# Patient Record
Sex: Female | Born: 1985 | Race: White | Hispanic: No | Marital: Married | Smoking: Never smoker
Health system: Southern US, Community
[De-identification: ages and names within clinical notes are randomized; demographics above are authoritative.]

## PROBLEM LIST (undated history)

## (undated) ENCOUNTER — Inpatient Hospital Stay: Payer: Self-pay | Admitting: Obstetrics and Gynecology

---
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
## 2012-01-16 ENCOUNTER — Ambulatory Visit: Payer: Self-pay | Admitting: General Practice

## 2013-01-13 ENCOUNTER — Ambulatory Visit: Payer: Self-pay | Admitting: Emergency Medicine

## 2014-05-19 DIAGNOSIS — M4147 Neuromuscular scoliosis, lumbosacral region: Secondary | ICD-10-CM | POA: Insufficient documentation

## 2014-05-24 IMAGING — CR DG FOOT COMPLETE 3+V*L*
1 series · 3 of 3 positions shown · non-contrast
Comparison: none

REASON FOR EXAM: INJURY
COMMENTS:

PROCEDURE:     MDR - MDR FOOT LT COMP W/OBLQUES  - January 13, 2013  [DATE]
RESULT:     There is no evidence of fracture, dislocation, or malalignment.

[Series 1: ap · 0.17mm/px · 3 of 3 slices shown]
[im 1/3]
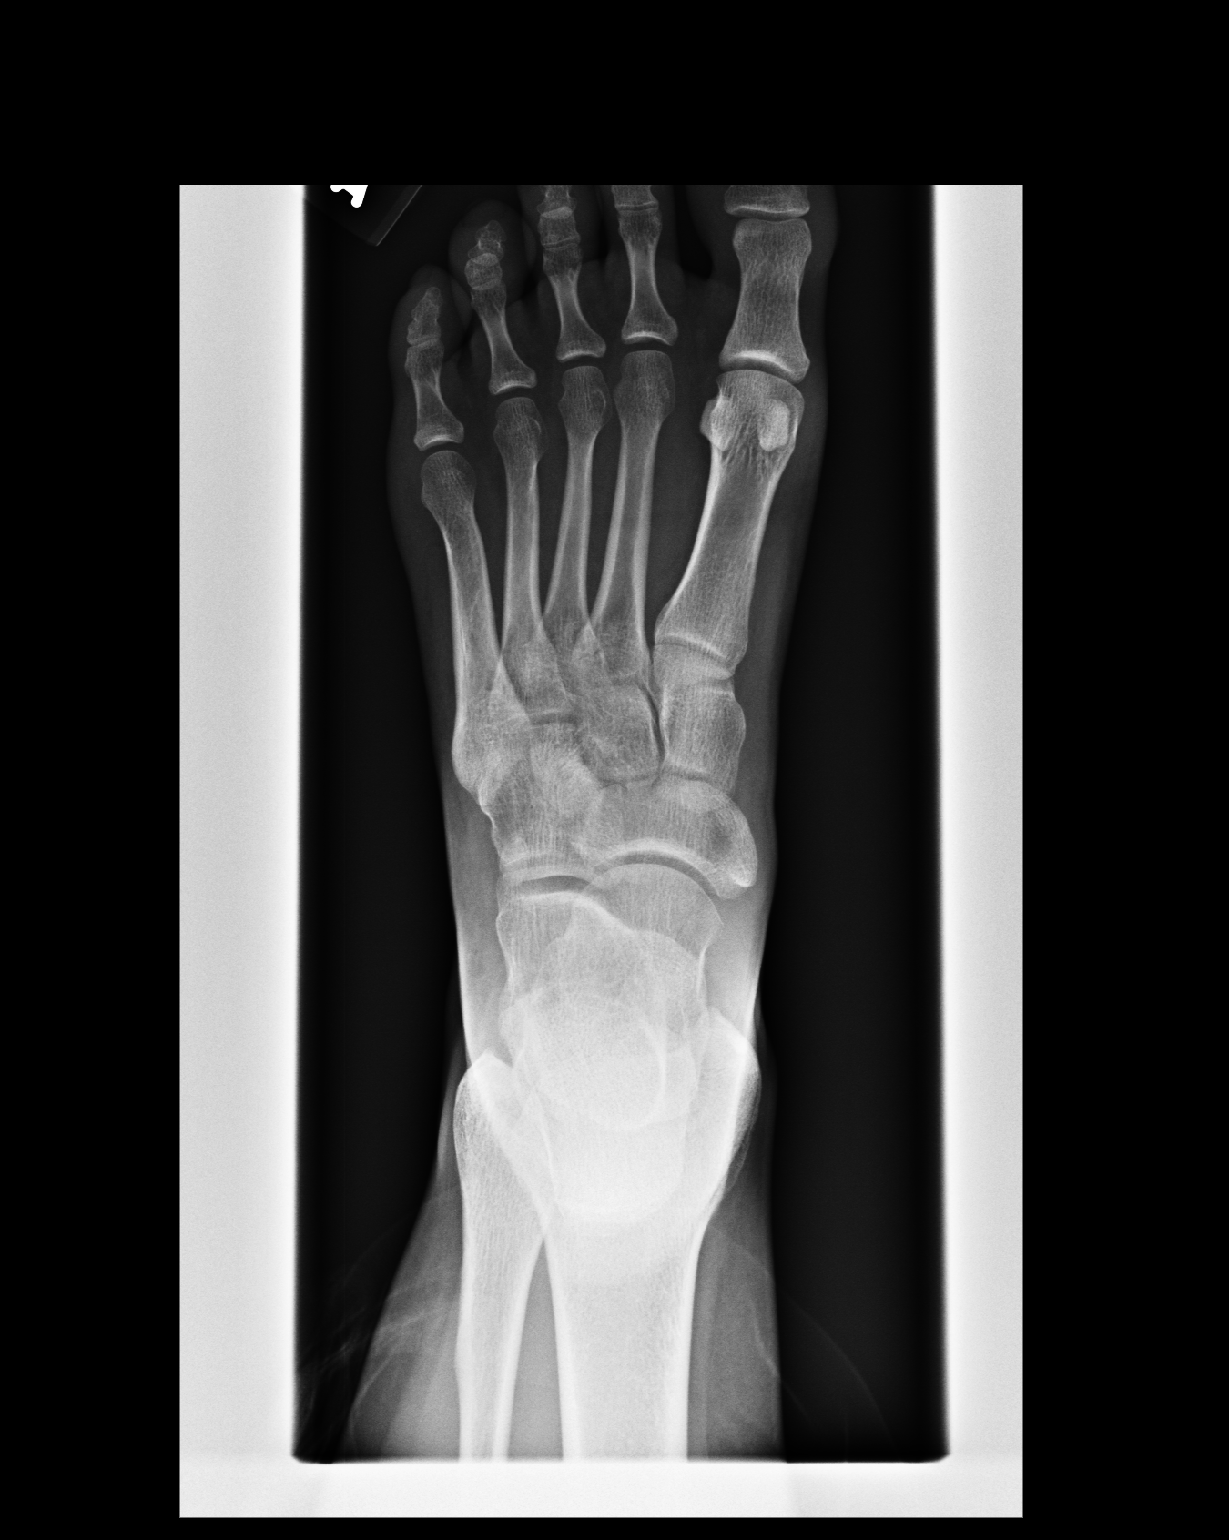
[im 2/3]
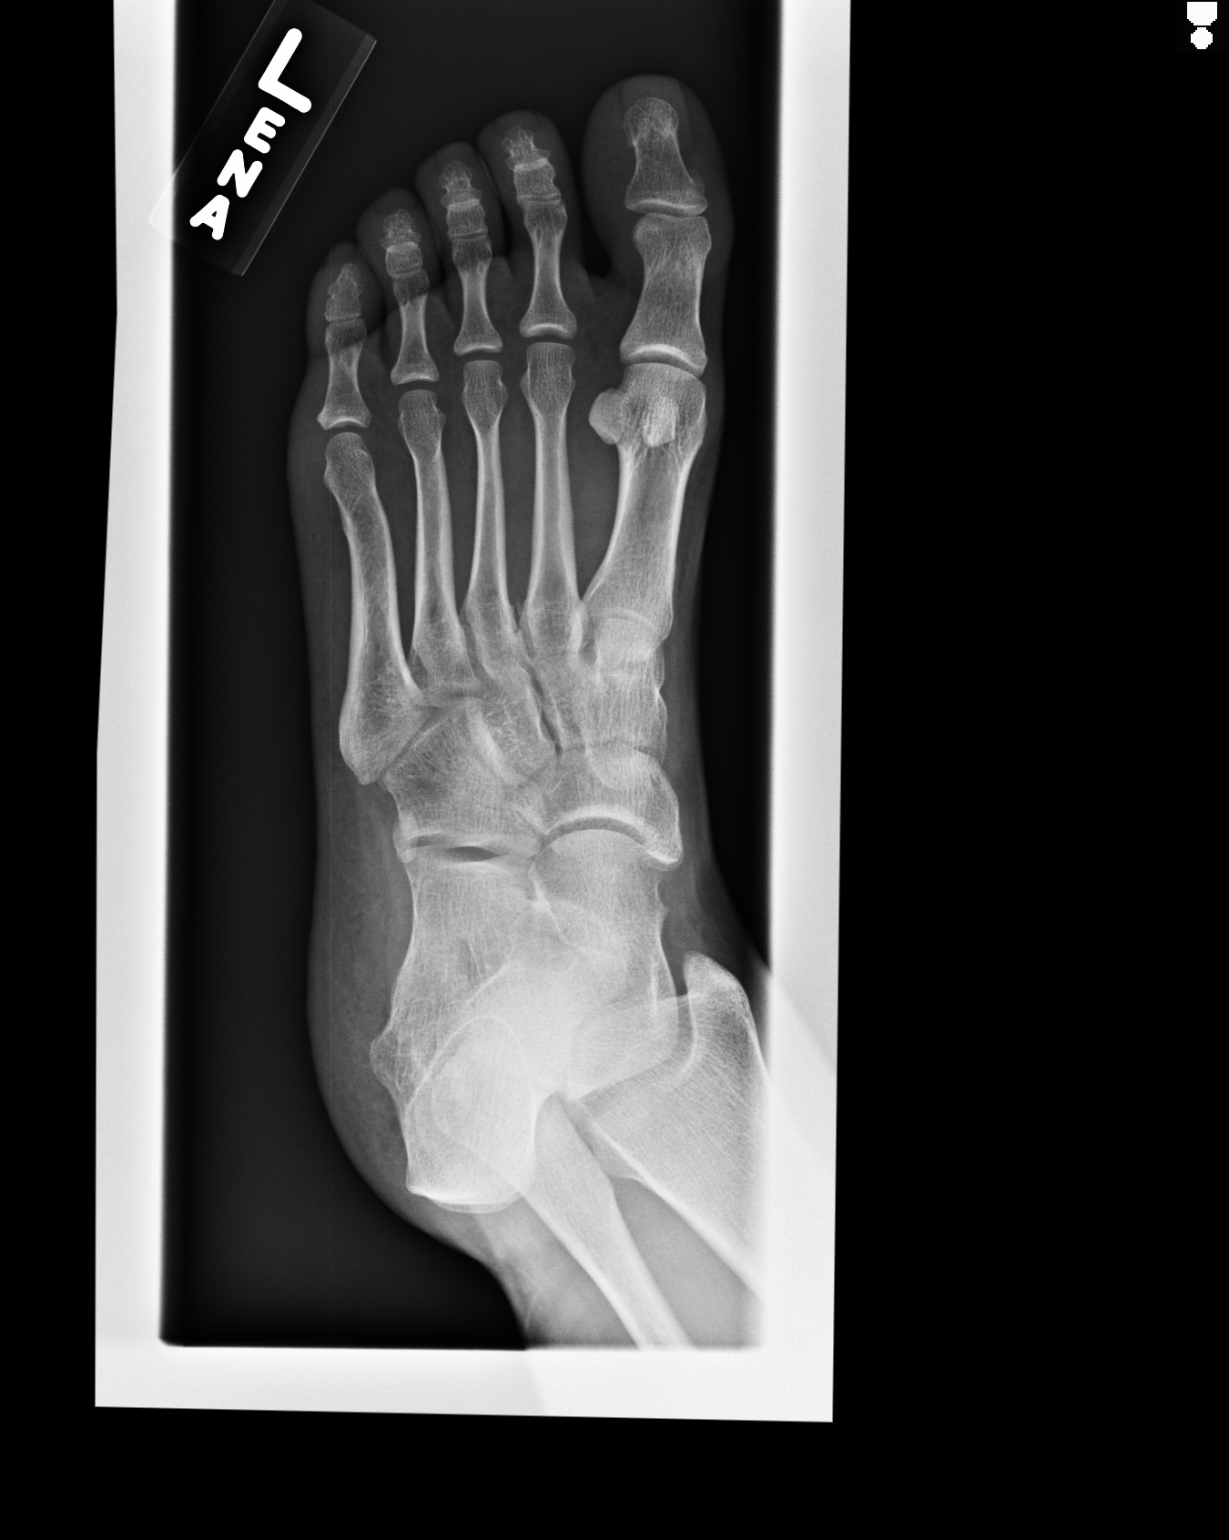
[im 3/3]
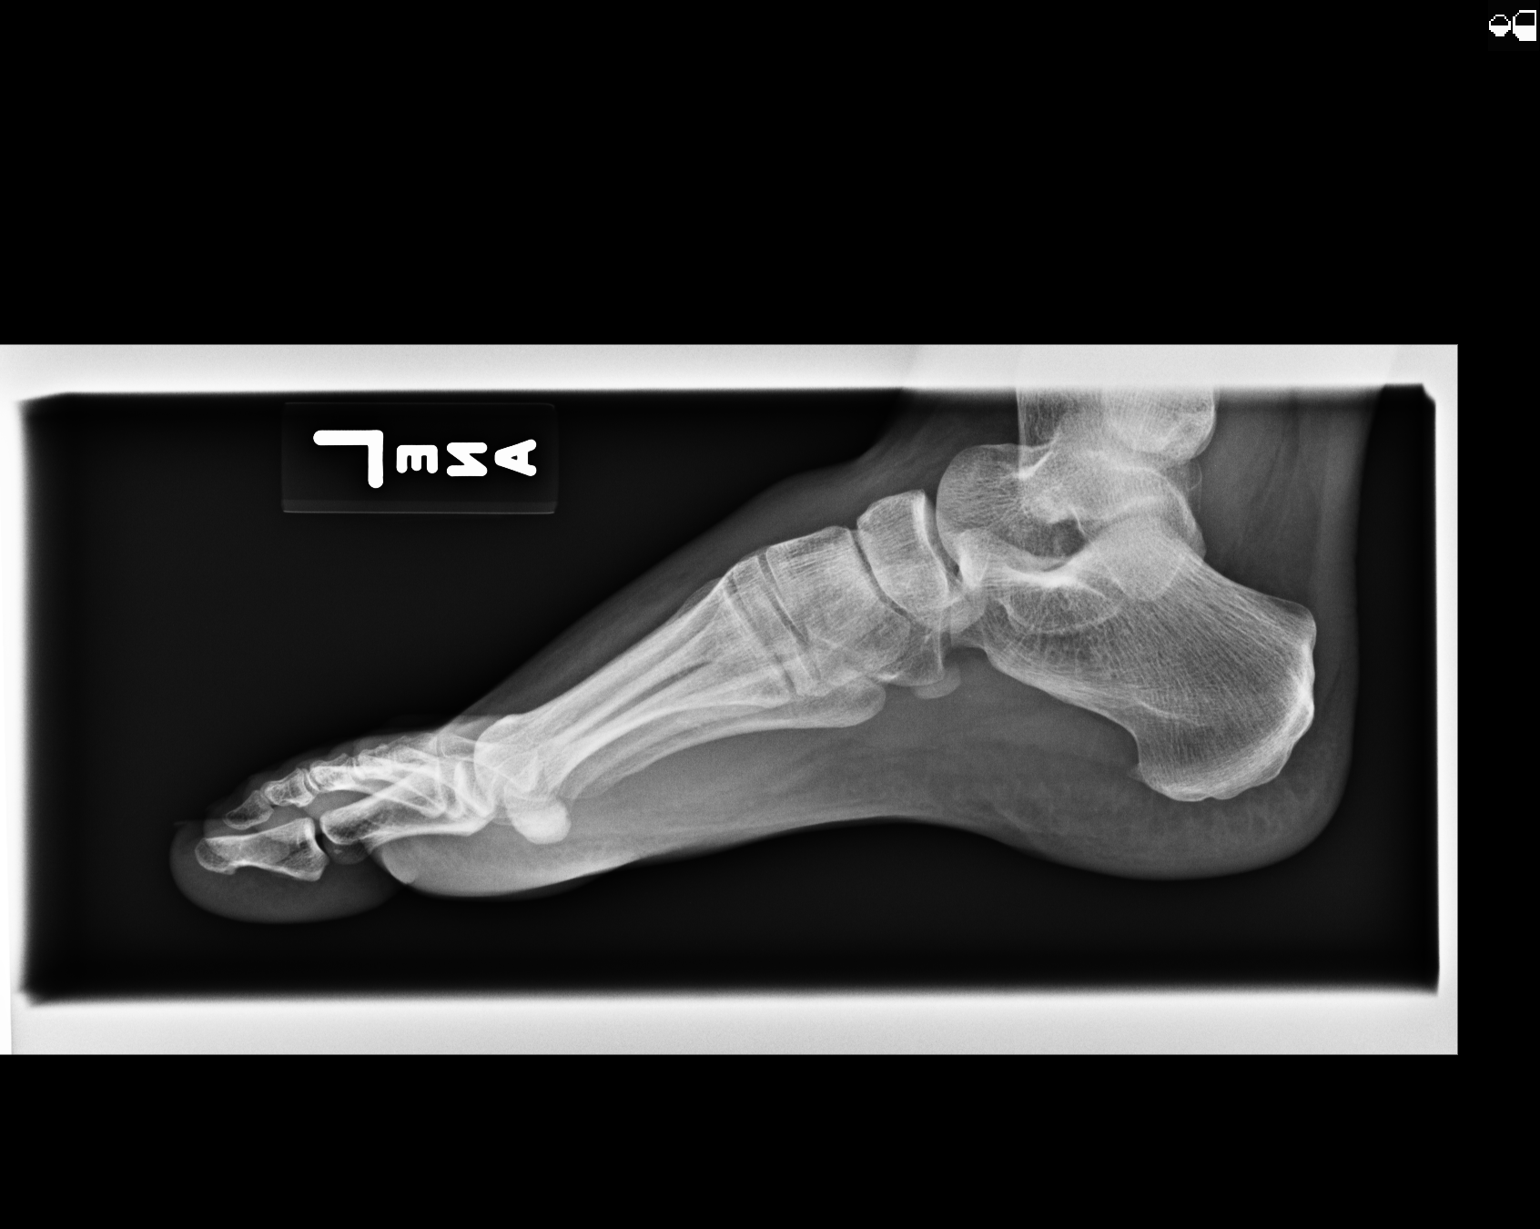

[3 of 3 positions shown; findings below may reference images not displayed]

IMPRESSION: 1. No evidence of acute abnormalities.
2. If there are persistent complaints of pain or persistent clinical
concern, a repeat evaluation in 7-10 days is recommended if clinically
warranted.

## 2014-11-16 ENCOUNTER — Inpatient Hospital Stay
Admit: 2014-11-16 | Disposition: A | Discharge status: Home / Self Care | Payer: Self-pay | Attending: Obstetrics and Gynecology | Admitting: Obstetrics and Gynecology

## 2014-11-16 LAB — CBC WITH DIFFERENTIAL/PLATELET
BASOS ABS: 0 10*3/uL (ref 0.0–0.1)
BASOS PCT: 0.2 %
EOS ABS: 0 10*3/uL (ref 0.0–0.7)
EOS PCT: 0 %
HCT: 39.5 % (ref 35.0–47.0)
HGB: 13 g/dL (ref 12.0–16.0)
LYMPHS ABS: 1.8 10*3/uL (ref 1.0–3.6)
Lymphocyte %: 8.8 %
MCH: 29.1 pg (ref 26.0–34.0)
MCHC: 33 g/dL (ref 32.0–36.0)
MCV: 88 fL (ref 80–100)
MONOS PCT: 1.8 %
Monocyte #: 0.4 x10 3/mm (ref 0.2–0.9)
NEUTROS ABS: 17.7 10*3/uL — AB (ref 1.4–6.5)
Neutrophil %: 89.2 %
Platelet: 235 10*3/uL (ref 150–440)
RBC: 4.49 10*6/uL (ref 3.80–5.20)
RDW: 14.5 % (ref 11.5–14.5)
WBC: 19.9 10*3/uL — ABNORMAL HIGH (ref 3.6–11.0)

## 2014-11-17 LAB — RAPID HIV SCREEN (HIV 1/2 AB+AG)

## 2014-11-18 LAB — HEMOGLOBIN: HGB: 10.5 g/dL — ABNORMAL LOW (ref 12.0–16.0)

## 2014-12-13 NOTE — Op Note (Signed)
PATIENT NAME:  Andrea Fletcher, [name] MR#:  [id number] DATE OF BIRTH:  03-18-1986  DATE OF PROCEDURE:  11/17/2014  PREOPERATIVE DIAGNOSES: 1.  Late term pregnancy. 2.  Group B Streptococcus negative.   3.  Augmentation of labor.   POSTOPERATIVE DIAGNOSES:  1.  Late term pregnancy. 2.  Group B Streptococcus negative.   3.  Augmentation of labor.  4.  Delivery of a viable female infant.  5.  Nuchal cord x 1.  6.  Occiput posterior.  7.  Terminal meconium.   SURGEON: Cline CoolsBethany E. Linetta Regner, M.D.   PROCEDURE: Normal spontaneous vaginal delivery over an intact perineum.   ESTIMATED BLOOD LOSS: 400 mL.   ANESTHESIA: Epidural.   SERIOUS COMPLICATIONS: None.  FINDINGS: Viable female infant weighing 3010 grams or 6 pounds 10 ounces with Apgars of 8 and 9 at 1 and 5 minutes respectively.  Terminal meconium with a tight nuchal cord x 1. Right occiput posterior position.   INDICATION FOR PROCEDURE: [name] is a 29 year old, gravida 1, para 0 at 4241 3/7 weeks estimated gestational age. She came in in active labor. She was augmented with Pitocin and progressed to fully dilated with a category 1 strip.   PROCEDURE: The patient entered the 2nd stage of labor at which point she began to push over an intact perineum. Good maternal effort was given. Fetal heart rate tracing was category 1 until the very end when there were approximately 4 minutes of deceleration when the patient was at +3/+4  station.  Ritgen maneuver was used to assist the maternal expulsion of the fetal head.  The baby was delivered through the nuchal cord. The cord was noted to be meconium-stained and the cord was doubly clamped and cut. The baby was passed off to awaiting NICU staff where he cried and became vigorous.   The placenta delivered spontaneously. It was examined and noted to be intact. A 3 vessel cord was noted. Meconium-stained membranes were also noted at this time. The fetal amniotic fluid had been clear prior to  delivery.  Thick meconium did follow with delivery of the placenta.  A brief manual evaluation of the lower uterine segment revealed a single small clot. Postpartum Pitocin was run.  The cervix and vagina were noted to be intact. A 1st degree laceration was repaired using 3-0 Vicryl suture for excellent reapproximation. The fundus was firm with massage and bleeding was minimal.   The patient tolerated these procedures well and is stable in the labor and delivery room.   ____________________________ Cline CoolsBethany E. Ravis Herne, MD beb:sp D: 11/17/2014 10:51:17 ET T: 11/17/2014 11:13:42 ET JOB#: [id number]  cc: Cline CoolsBethany E. Dontarius Sheley, MD, <Dictator> Cline CoolsBETHANY E Surya Schroeter MD ELECTRONICALLY SIGNED 11/17/2014 17:42

## 2014-12-22 NOTE — H&P (Signed)
L&D Evaluation:  History:  HPI 29 yo G1P0 with LMP of 02/10/14 & EDD of 11/07/14 c/w [facility] 19 [facility] significant for low risk pregnancy. Pt is now post-dates and here with UC's q 1-2 mins with mod UC's, no ROM, no VB or decreased FM. Pt is mod uncomfortable. FHR is Cat I. Koreas on 07/16/14 WNL. 1 h GCt abnormal but, 3 h GCT was normal. A +, Antibody neg, Pap WNL, HIV/Hep B neg, 1st test: NT neg,  Quad neg, Hgb 11, Varicella immune. GC/CH neg, Tdap given at 09/29/14. Blood consent signed for tranfusion if needed. Planning to breastfeed.   Presents with contractions   Patient's Medical History Scoliosis, mild dysplasia   Patient's Surgical History none  Colposcopy, widsom teeth extraction   Medications Pre Natal Vitamins   Allergies NKDA   Social History none   Family History Non-Contributory   ROS:  ROS All systems were reviewed.  HEENT, CNS, GI, GU, Respiratory, CV, Renal and Musculoskeletal systems were found to be normal.   Exam:  Vital Signs stable   General no apparent distress, other   Mental Status clear   Chest clear   Heart normal sinus rhythm, no murmur/gallop/rubs   Abdomen gravid, tender with contractions   Estimated Fetal Weight Average for gestational age   Fetal Position vtx   Back no CVAT   Edema 1+   Reflexes 1+   Clonus negative   Pelvic 2.5 cms per nursing   Mebranes Intact   FHT normal rate with no decels   Ucx regular, q 1-2 mins, lasting 45-60 secs   Lymph no lymphadenopathy   Impression:  Impression active labor   Plan:  Plan monitor contractions and for cervical change   Comments Pt is walking to increase UC's.   Electronic Signatures: Sharee PimpleJones, Karyl Sharrar W (CNM)  (Signed 04-Apr-16 12:04)  Authored: L&D Evaluation   Last Updated: 04-Apr-16 12:04 by Sharee PimpleJones, Amonie Wisser W (CNM)

## 2016-11-16 ENCOUNTER — Other Ambulatory Visit: Payer: Self-pay | Admitting: Obstetrics and Gynecology

## 2016-11-16 DIAGNOSIS — O4102X0 Oligohydramnios, second trimester, not applicable or unspecified: Secondary | ICD-10-CM

## 2016-12-04 ENCOUNTER — Ambulatory Visit: Payer: Self-pay

## 2018-06-24 LAB — OB RESULTS CONSOLE HEPATITIS B SURFACE ANTIGEN: Hepatitis B Surface Ag: NEGATIVE

## 2018-06-24 LAB — OB RESULTS CONSOLE RUBELLA ANTIBODY, IGM: Rubella: IMMUNE

## 2018-06-24 LAB — OB RESULTS CONSOLE VARICELLA ZOSTER ANTIBODY, IGG: Varicella: IMMUNE

## 2018-08-14 NOTE — L&D Delivery Note (Signed)
Date of delivery: 01/13/2019 Estimated Date of Delivery: 01/07/19 Patient's last menstrual period was 04/02/2018. EGA: [redacted]w[redacted]d  Delivery Note At 3:24 AM a viable female was delivered via Vaginal, Spontaneous (Presentation: cephalic; ROA).  APGAR: 9, 9; weight: pending (skin to skin)   Placenta status: spontaneous, intact.  Cord: 3vv, with the following complications: none apparent.  Cord pH: not collected  Anesthesia:  epidural Episiotomy: None Lacerations: None Suture Repair: n/a Est. Blood Loss (mL):  235cc measured  Mom presented to L&D with spontaneous labor.  epidual placed. Progressed to complete, second stage: 10 mins, with delivery of fetal head with restitution to LOT.  Mom reached down and delivered shoulders and placed skin to skin without difficulty.  Baby attended to by peds.   Cord was then clamped and cut by mom when pulseless.  Placenta spontaneously delivered, intact.   IV pitocin given for hemorrhage prophylaxis.  We sang happy birthday to not-yet-named baby boy.   Mom to postpartum.  Baby to Couplet care / Skin to Skin.  Chelsea C Ward 01/13/2019, 4:10 AM

## 2018-12-11 LAB — OB RESULTS CONSOLE GC/CHLAMYDIA
Chlamydia: NEGATIVE
Gonorrhea: NEGATIVE

## 2018-12-11 LAB — OB RESULTS CONSOLE RPR: RPR: NONREACTIVE

## 2018-12-11 LAB — OB RESULTS CONSOLE HIV ANTIBODY (ROUTINE TESTING): HIV: NONREACTIVE

## 2018-12-11 LAB — OB RESULTS CONSOLE GBS: GBS: NEGATIVE

## 2018-12-30 ENCOUNTER — Ambulatory Visit: Payer: Self-pay

## 2018-12-30 ENCOUNTER — Ambulatory Visit
Admission: RE | Admit: 2018-12-30 | Discharge: 2018-12-30 | Disposition: A | Discharge status: Home / Self Care | Payer: BC Managed Care – PPO | Source: Ambulatory Visit | Attending: Obstetrics and Gynecology | Admitting: Obstetrics and Gynecology

## 2018-12-30 ENCOUNTER — Other Ambulatory Visit: Payer: Self-pay

## 2018-12-30 DIAGNOSIS — Z01812 Encounter for preprocedural laboratory examination: Secondary | ICD-10-CM | POA: Insufficient documentation

## 2018-12-30 DIAGNOSIS — Z1159 Encounter for screening for other viral diseases: Secondary | ICD-10-CM | POA: Insufficient documentation

## 2018-12-31 LAB — NOVEL CORONAVIRUS, NAA (HOSP ORDER, SEND-OUT TO REF LAB; TAT 18-24 HRS): SARS-CoV-2, NAA: NOT DETECTED

## 2019-01-07 ENCOUNTER — Ambulatory Visit: Admission: RE | Admit: 2019-01-07 | Payer: BC Managed Care – PPO | Source: Ambulatory Visit

## 2019-01-09 NOTE — Progress Notes (Signed)
   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  Desired IOL  Scheduled 01/09/2019  Anemia, 9.3 at 28wks  Started on OTC iron supplement 10/15/2018  4/29: hgb 10.7  Screening results and needs:  NOB:   Depression Screening: 6  MBT: A positive  Ab screen: Neg  Pap: 02/22/16, Neg HIV: NR  Hep B/RPR: Neg/NR      G/C: Neg  Rubella: Immune   VZV: Immune  Aneuploidy:   First trimester: (Did they do cffDNA or NT/blood draw?) declines 1st tri screen 06/24/18  Informaseq:   NT:      Second trimester (AFP/tetra): Declined 07/22/18  28 weeks:   Depression Score: 5  Blood consent: done 10/15/2018  Hgb: 9.3  Plt count: 225   Glucola: 124  36 weeks: done 4/29-  GBS: neg  G/C:neg/neg   Hgb: 10.7  Platelets: 247   HIV: Neg  RPR:  NR   Last [place]:   06/12/18:Uterus anteverted,Single, viable IUP, S=[redacted]w[redacted]d,FHR=167bpm,Yolk sac and amnion imaged,Cervix long and closed=3.57cm,No free fluid seen,Rt ovarian complex cyst=2.3cm,Subchorionic hemorrhage seen: Rt of IUP=1.56 x 0.62 x 2.06cm  07/08/18: f/u [facility]: Single iup seen with fetal pole  cwd, Crl=7.69 cm= 13.6 wks, Fhr= 154 bpm, bil ovs wnl, Free fluid  pcds, No sch seen , Cervical length= 5.09 cm  08/13/18: Single Gestation seen CWD 19.0 WKS, Breech, Post Lt Lat Plac. FHT=142 BPM. Normal Anat Seen.  Immunization:    Flu in season - Given at [employer] this season   Tdap at 27-36 weeks - 12/11/18  Social: no changes  Contraception Plan:   Feeding Plan:

## 2019-01-12 ENCOUNTER — Other Ambulatory Visit: Payer: Self-pay

## 2019-01-12 ENCOUNTER — Inpatient Hospital Stay
Admission: EM | Admit: 2019-01-12 | Discharge: 2019-01-14 | DRG: 807 | Disposition: A | Discharge status: Home / Self Care | Payer: BC Managed Care – PPO | Attending: Obstetrics and Gynecology | Admitting: Obstetrics and Gynecology

## 2019-01-12 DIAGNOSIS — O26893 Other specified pregnancy related conditions, third trimester: Secondary | ICD-10-CM | POA: Diagnosis present

## 2019-01-12 DIAGNOSIS — O9902 Anemia complicating childbirth: Principal | ICD-10-CM | POA: Diagnosis present

## 2019-01-12 DIAGNOSIS — Z1159 Encounter for screening for other viral diseases: Secondary | ICD-10-CM

## 2019-01-12 DIAGNOSIS — R6339 Other feeding difficulties: Secondary | ICD-10-CM

## 2019-01-12 DIAGNOSIS — D649 Anemia, unspecified: Secondary | ICD-10-CM | POA: Diagnosis present

## 2019-01-12 DIAGNOSIS — R633 Feeding difficulties: Secondary | ICD-10-CM

## 2019-01-12 DIAGNOSIS — Z3A40 40 weeks gestation of pregnancy: Secondary | ICD-10-CM

## 2019-01-12 DIAGNOSIS — Z3493 Encounter for supervision of normal pregnancy, unspecified, third trimester: Secondary | ICD-10-CM

## 2019-01-12 MED ORDER — LACTATED RINGERS IV SOLN
500.0000 mL | INTRAVENOUS | Status: DC | PRN
  Administered 2019-01-13: 01:00:00 500 mL via INTRAVENOUS

## 2019-01-12 MED ORDER — SOD CITRATE-CITRIC ACID 500-334 MG/5ML PO SOLN
30.0000 mL | ORAL | Status: DC | PRN
Start: 2019-01-12 — End: 2019-01-13

## 2019-01-12 MED ORDER — OXYTOCIN 10 UNIT/ML IJ SOLN
INTRAMUSCULAR | Status: AC
  Filled 2019-01-12: qty 2

## 2019-01-12 MED ORDER — LIDOCAINE HCL (PF) 1 % IJ SOLN: 30.0000 mL | INTRAMUSCULAR | Status: DC | PRN

## 2019-01-12 MED ORDER — OXYTOCIN 40 UNITS IN NORMAL SALINE INFUSION - SIMPLE MED
2.5000 [IU]/h | INTRAVENOUS | Status: DC
  Administered 2019-01-13: 2.5 [IU]/h via INTRAVENOUS

## 2019-01-12 MED ORDER — ACETAMINOPHEN 325 MG PO TABS: 650.0000 mg | ORAL_TABLET | ORAL | Status: DC | PRN

## 2019-01-12 MED ORDER — OXYTOCIN 40 UNITS IN NORMAL SALINE INFUSION - SIMPLE MED
INTRAVENOUS | Status: AC
  Filled 2019-01-12: qty 1000

## 2019-01-12 MED ORDER — AMMONIA AROMATIC IN INHA
RESPIRATORY_TRACT | Status: AC
  Filled 2019-01-12: qty 10

## 2019-01-12 MED ORDER — ONDANSETRON HCL 4 MG/2ML IJ SOLN: 4.0000 mg | Freq: Four times a day (QID) | INTRAMUSCULAR | Status: DC | PRN

## 2019-01-12 MED ORDER — LACTATED RINGERS IV SOLN
INTRAVENOUS | Status: DC
  Administered 2019-01-13: 01:00:00 via INTRAVENOUS

## 2019-01-12 MED ORDER — OXYTOCIN BOLUS FROM INFUSION
500.0000 mL | Freq: Once | INTRAVENOUS | Status: AC
  Administered 2019-01-13: 03:00:00 500 mL via INTRAVENOUS

## 2019-01-12 MED ORDER — OXYTOCIN 40 UNITS IN NORMAL SALINE INFUSION - SIMPLE MED: 1.0000 m[IU]/min | INTRAVENOUS | Status: DC

## 2019-01-12 MED ORDER — BUTORPHANOL TARTRATE 2 MG/ML IJ SOLN: 1.0000 mg | INTRAMUSCULAR | Status: DC | PRN

## 2019-01-12 MED ORDER — MISOPROSTOL 25 MCG QUARTER TABLET: 25.0000 ug | ORAL_TABLET | ORAL | Status: DC | PRN

## 2019-01-12 MED ORDER — MISOPROSTOL 200 MCG PO TABS
ORAL_TABLET | ORAL | Status: AC
  Filled 2019-01-12: qty 4

## 2019-01-12 MED ORDER — LIDOCAINE HCL (PF) 1 % IJ SOLN
INTRAMUSCULAR | Status: AC
  Filled 2019-01-12: qty 30

## 2019-01-12 MED ORDER — TERBUTALINE SULFATE 1 MG/ML IJ SOLN: 0.2500 mg | Freq: Once | INTRAMUSCULAR | Status: DC | PRN

## 2019-01-12 NOTE — H&P (Addendum)
OB History & Physical   History of Present Illness:  Chief Complaint:   HPI:  Andrea Fletcher is a 33 y.o. G3P2002 female at [redacted]w[redacted]d dated by  Patient's last menstrual period was 04/02/2018. Estimated Date of Delivery: 01/07/19  She presents to L&D with worsening contractions, in labor.  +FM, + CTX, no LOF, no VB  Pregnancy Issues: 1. History of IUGR, first baby 6-10 @ 41wks, second baby 4lb0oz @ 38wks and known CMV infection. 2. Anemia in pregnancy  Maternal Medical History:  History reviewed. No pertinent past medical history.  History reviewed. No pertinent surgical history.    Prior to Admission medications   Medication Sig Start Date End Date Taking? Authorizing Provider  ferrous sulfate 325 (65 FE) MG tablet Take 325 mg by mouth daily with breakfast.   Yes [provider]  Prenatal Vit-Fe Fumarate-FA (MULTIVITAMIN-PRENATAL) 27-0.8 MG TABS tablet Take 1 tablet by mouth daily at 12 noon.   Yes [provider]     Prenatal care site: [facility]   Social History: She  reports that she has never smoked. She has never used smokeless tobacco. She reports previous alcohol use. She reports that she does not use drugs.  Family History: family history is not on file. High blood pressure, lung cancer, diabetes, stroke, hyperlipidemia, thyroid disease.  Review of Systems: A full review of systems was performed and negative except as noted in the HPI.     Physical Exam:  Vital Signs: BP 109/61   Pulse 75   Temp 97.7 F (36.5 C) (Oral)   Resp 17   Ht 5\' 2"  (1.575 m)   Wt 62.6 kg   LMP 04/02/2018   SpO2 99%   Breastfeeding Unknown   BMI 25.24 kg/m  General: no acute distress.  HEENT: normocephalic, atraumatic Heart: regular rate & rhythm.  No murmurs/rubs/gallops Lungs: clear to auscultation bilaterally, normal respiratory effort Abdomen: soft, gravid, non-tender;  EFW: 6.12lbs Pelvic:   External: Normal external female  genitalia  Cervix: 5/70/-2   Extremities: non-tender, symmetric, mild edema bilaterally.  DTRs: 2+ Neurologic: Alert & oriented x 3.     Pertinent Results:  Prenatal Labs: Blood type/Rh A pos  Antibody screen neg  Rubella Immune  Varicella Immune  RPR NR  HBsAg Neg  HIV NR  GC neg  Chlamydia neg  Genetic screening declined  1 hour GTT 124  3 hour GTT   GBS neg   FHT: 125 mod + accels no decels TOCO: 2-25min   Cephalic by leopolds  Assessment:  Andrea Fletcher is a 33 y.o. G58P2002 female at [redacted]w[redacted]d with labor.   Plan:  1. Admit to Labor & Delivery 2. CBC, T&S, Clrs, IVF 3. GBS neg 4. Consents obtained. 5. Continuous efm/toco 6. Expectant management.   7. Category 1 tracing. 8. Epidural if requested.  ----- Ranae Plumber, MD Attending Obstetrician and Gynecologist [facility], Department of OB/GYN [facility]

## 2019-01-12 NOTE — OB Triage Note (Signed)
Pt G3P2 presents with CTX at [redacted]w[redacted]d. Reports every 4 min. +FM. Denies LOF/ bleeding. No other complaints

## 2019-01-13 ENCOUNTER — Inpatient Hospital Stay: Payer: BC Managed Care – PPO | Admitting: Anesthesiology

## 2019-01-13 LAB — CBC
HCT: 35.4 % — ABNORMAL LOW (ref 36.0–46.0)
Hemoglobin: 11.8 g/dL — ABNORMAL LOW (ref 12.0–15.0)
MCH: 29.8 pg (ref 26.0–34.0)
MCHC: 33.3 g/dL (ref 30.0–36.0)
MCV: 89.4 fL (ref 80.0–100.0)
Platelets: 275 10*3/uL (ref 150–400)
RBC: 3.96 MIL/uL (ref 3.87–5.11)
RDW: 13.1 % (ref 11.5–15.5)
WBC: 12.4 10*3/uL — ABNORMAL HIGH (ref 4.0–10.5)
nRBC: 0 % (ref 0.0–0.2)

## 2019-01-13 LAB — TYPE AND SCREEN
ABO/RH(D): A POS
Antibody Screen: NEGATIVE

## 2019-01-13 LAB — SARS CORONAVIRUS 2 BY RT PCR (HOSPITAL ORDER, PERFORMED IN ~~LOC~~ HOSPITAL LAB): SARS Coronavirus 2: NEGATIVE

## 2019-01-13 MED ORDER — ONDANSETRON HCL 4 MG/2ML IJ SOLN: 4.0000 mg | INTRAMUSCULAR | Status: DC | PRN

## 2019-01-13 MED ORDER — DIPHENHYDRAMINE HCL 50 MG/ML IJ SOLN: 12.5000 mg | INTRAMUSCULAR | Status: DC | PRN

## 2019-01-13 MED ORDER — COCONUT OIL OIL
1.0000 "application " | TOPICAL_OIL | Status: DC | PRN
  Administered 2019-01-13: 1 via TOPICAL
  Filled 2019-01-13: qty 120

## 2019-01-13 MED ORDER — FENTANYL 2.5 MCG/ML W/ROPIVACAINE 0.15% IN NS 100 ML EPIDURAL (ARMC): 12.0000 mL/h | EPIDURAL | Status: DC

## 2019-01-13 MED ORDER — IBUPROFEN 600 MG PO TABS
600.0000 mg | ORAL_TABLET | Freq: Four times a day (QID) | ORAL | Status: DC
  Administered 2019-01-13 – 2019-01-14 (×5): 600 mg via ORAL
  Filled 2019-01-13 (×5): qty 1

## 2019-01-13 MED ORDER — SIMETHICONE 80 MG PO CHEW: 80.0000 mg | CHEWABLE_TABLET | ORAL | Status: DC | PRN

## 2019-01-13 MED ORDER — PRENATAL MULTIVITAMIN CH
1.0000 | ORAL_TABLET | Freq: Every day | ORAL | Status: DC
  Filled 2019-01-13: qty 1

## 2019-01-13 MED ORDER — PHENYLEPHRINE 40 MCG/ML (10ML) SYRINGE FOR IV PUSH (FOR BLOOD PRESSURE SUPPORT): 80.0000 ug | PREFILLED_SYRINGE | INTRAVENOUS | Status: DC | PRN

## 2019-01-13 MED ORDER — EPHEDRINE 5 MG/ML INJ: 10.0000 mg | INTRAVENOUS | Status: DC | PRN

## 2019-01-13 MED ORDER — DOCUSATE SODIUM 100 MG PO CAPS: 100.0000 mg | ORAL_CAPSULE | Freq: Two times a day (BID) | ORAL | Status: DC

## 2019-01-13 MED ORDER — ACETAMINOPHEN 500 MG PO TABS
1000.0000 mg | ORAL_TABLET | Freq: Four times a day (QID) | ORAL | Status: DC | PRN
  Administered 2019-01-13: 1000 mg via ORAL
  Filled 2019-01-13: qty 2

## 2019-01-13 MED ORDER — FENTANYL 2.5 MCG/ML W/ROPIVACAINE 0.15% IN NS 100 ML EPIDURAL (ARMC)
EPIDURAL | Status: AC
  Filled 2019-01-13: qty 100

## 2019-01-13 MED ORDER — LACTATED RINGERS IV SOLN: 500.0000 mL | Freq: Once | INTRAVENOUS | Status: DC

## 2019-01-13 MED ORDER — LIDOCAINE HCL (PF) 1 % IJ SOLN
INTRAMUSCULAR | Status: DC | PRN
  Administered 2019-01-13: 1 mL

## 2019-01-13 MED ORDER — WITCH HAZEL-GLYCERIN EX PADS: 1.0000 "application " | MEDICATED_PAD | CUTANEOUS | Status: DC

## 2019-01-13 MED ORDER — BENZOCAINE-MENTHOL 20-0.5 % EX AERO: 1.0000 "application " | INHALATION_SPRAY | CUTANEOUS | Status: DC | PRN

## 2019-01-13 MED ORDER — DIPHENHYDRAMINE HCL 25 MG PO CAPS: 25.0000 mg | ORAL_CAPSULE | Freq: Four times a day (QID) | ORAL | Status: DC | PRN

## 2019-01-13 MED ORDER — DIBUCAINE (PERIANAL) 1 % EX OINT: 1.0000 "application " | TOPICAL_OINTMENT | CUTANEOUS | Status: DC | PRN

## 2019-01-13 MED ORDER — ONDANSETRON HCL 4 MG PO TABS: 4.0000 mg | ORAL_TABLET | ORAL | Status: DC | PRN

## 2019-01-13 MED ORDER — FENTANYL 2.5 MCG/ML W/ROPIVACAINE 0.15% IN NS 100 ML EPIDURAL (ARMC)
EPIDURAL | Status: DC | PRN
  Administered 2019-01-13: 12 mL/h via EPIDURAL

## 2019-01-13 MED ORDER — SODIUM CHLORIDE 0.9 % IV SOLN
INTRAVENOUS | Status: DC | PRN
  Administered 2019-01-13 (×3): 5 mL via EPIDURAL

## 2019-01-13 NOTE — Anesthesia Procedure Notes (Signed)
Epidural Patient location during procedure: OB  Staffing Anesthesiologist: Jovita Gamma, MD Performed: anesthesiologist   Preanesthetic Checklist Completed: patient identified, site marked, surgical consent, pre-op evaluation, timeout performed, IV checked, risks and benefits discussed and monitors and equipment checked  Epidural Patient position: sitting Prep: ChloraPrep Patient monitoring: heart rate, continuous pulse ox and blood pressure Approach: midline Location: L4-L5 Injection technique: LOR saline  Needle:  Needle type: Tuohy  Needle gauge: 18 G Needle length: 9 cm and 9 Needle insertion depth: 6 cm Catheter type: closed end flexible Catheter size: 20 Guage Catheter at skin depth: 10 cm Test dose: negative and Other  Assessment Events: blood not aspirated, injection not painful, no injection resistance, negative IV test and no paresthesia  Additional Notes   Patient tolerated the insertion well without complications.Reason for block:procedure for pain

## 2019-01-13 NOTE — Discharge Summary (Signed)
Obstetrical Discharge Summary  Patient Name: Andrea Fletcher DOB: 1986-08-09 MRN: [id number]  Date of Admission: 01/12/2019 Date of Delivery: 01/13/2019 Delivered by: Ranae Plumberhelsea Ward, MD Date of Discharge: 01/14/2019 [facility]   GUY:[id number]'VLMP:Patient's last menstrual period was 04/02/2018. EDC Estimated Date of Delivery: 01/07/19 Gestational Age at Delivery: 383w6d   Antepartum complications:  1. History of IUGR 2. Anemia in pregnancy  Admitting Diagnosis: Labor Secondary Diagnosis: Patient Active Problem List   Diagnosis Date Noted  . Supervision of normal pregnancy in third trimester 01/12/2019    Augmentation: AROM Complications: None Intrapartum complications/course: Mom presented to L&D with spontaneous labor.  epidual placed. Progressed to complete, second stage: 10 mins, with delivery of fetal head with restitution to LOT.  Mom reached down and delivered shoulders and placed skin to skin without difficulty.  Baby attended to by peds.   Cord was then clamped and cut by mom when pulseless.  Placenta spontaneously delivered, intact.   IV pitocin given for hemorrhage prophylaxis. Delivery Type: spontaneous vaginal delivery Anesthesia: epidural Placenta: spontaneous Laceration: none Episiotomy: none Newborn Data: Live born female Birth Weight:  3020 gm APGAR: 9, 9  Newborn Delivery   Birth date/time:  01/13/2019 03:24:00 Delivery type:  Vaginal, Spontaneous     Postpartum Procedures: none  Post partum course:  Patient had an uncomplicated postpartum course.  By time of discharge on PPD#1, her pain was controlled on oral pain medications; she had appropriate lochia and was ambulating, voiding without difficulty and tolerating regular diet.  She was deemed stable for discharge to home.    Discharge Physical Exam: 01/14/2019  BP 102/79 (BP Location: Left Arm)   Pulse 68   Temp 98.1 F (36.7 C) (Oral)   Resp 20   Ht 5\' 2"  (1.575 m)   Wt 62.6 kg   LMP  04/02/2018   SpO2 99%   Breastfeeding Unknown   BMI 25.24 kg/m   General: NAD CV: RRR Pulm: CTABL, nl effort ABD: s/nd/nt, fundus firm and below the umbilicus Lochia: moderate DVT Evaluation: LE non-ttp, no evidence of DVT on exam.  Hemoglobin  Date Value Ref Range Status  01/14/2019 10.3 (L) 12.0 - 15.0 g/dL Final   HGB  Date Value Ref Range Status  11/18/2014 10.5 (L) 12.0 - 16.0 g/dL Final   HCT  Date Value Ref Range Status  01/14/2019 31.2 (L) 36.0 - 46.0 % Final  11/16/2014 39.5 35.0 - 47.0 % Final   Results for orders placed or performed during the hospital encounter of 01/12/19 (from the past 24 hour(s))  CBC     Status: Abnormal   Collection Time: 01/14/19  5:37 AM  Result Value Ref Range   WBC 10.8 (H) 4.0 - 10.5 K/uL   RBC 3.41 (L) 3.87 - 5.11 MIL/uL   Hemoglobin 10.3 (L) 12.0 - 15.0 g/dL   HCT 95.631.2 (L) 38.736.0 - 56.446.0 %   MCV 91.5 80.0 - 100.0 fL   MCH 30.2 26.0 - 34.0 pg   MCHC 33.0 30.0 - 36.0 g/dL   RDW 33.213.3 95.111.5 - 88.415.5 %   Platelets 224 150 - 400 K/uL   nRBC 0.0 0.0 - 0.2 %     Disposition: stable, discharge to home. Baby Feeding: breastmilk Baby Disposition: home with mom  Rh Immune globulin given: n/a Rubella vaccine given: n/a Flu vaccine given in AP or PP setting: AP  Contraception: TBD  Prenatal Labs:  Blood type/Rh A pos  Antibody screen neg  Rubella Immune  Varicella Immune  RPR NR  HBsAg Neg  HIV NR  GC neg  Chlamydia neg  Genetic screening declined  1 hour GTT 124  3 hour GTT   GBS neg      Plan:  Andrea Crocker was discharged to home in good condition. Follow-up appointment with Dr. Elesa Massed in 6 weeks.  Discharge Medications: Allergies as of 01/14/2019   Not on File     Medication List    STOP taking these medications   ferrous sulfate 325 (65 FE) MG tablet     TAKE these medications   multivitamin-prenatal 27-0.8 MG Tabs tablet Take 1 tablet by mouth daily at 12 noon.       Follow-up  Information    Ward, Elenora Fender, MD Follow up in 6 week(s).   Specialty:  Obstetrics and Gynecology Why:  pp care  Contact information: [address] [place] [address] [phone]           Signed: Maisie Fus j SchermerhornMD Hit refresh and delete this line

## 2019-01-13 NOTE — Anesthesia Preprocedure Evaluation (Addendum)
Anesthesia Evaluation  Patient identified by MRN, date of birth, ID band Patient awake    Reviewed: Allergy & Precautions, H&P , NPO status , Patient's Chart, lab work & pertinent test results  Airway Mallampati: I  TM Distance: >3 FB     Dental  (+) Teeth Intact   Pulmonary neg pulmonary ROS,           Cardiovascular Exercise Tolerance: Good (-) hypertensionnegative cardio ROS       Neuro/Psych    GI/Hepatic negative GI ROS,   Endo/Other    Renal/GU   negative genitourinary   Musculoskeletal   Abdominal   Peds  Hematology negative hematology ROS (+)   Anesthesia Other Findings History reviewed. No pertinent past medical history.  History reviewed. No pertinent surgical history.  BMI    Body Mass Index:  25.24 kg/m      Reproductive/Obstetrics (+) Pregnancy                            Anesthesia Physical Anesthesia Plan  ASA: II  Anesthesia Plan: Epidural   Post-op Pain Management:    Induction:   PONV Risk Score and Plan:   Airway Management Planned:   Additional Equipment:   Intra-op Plan:   Post-operative Plan:   Informed Consent: I have reviewed the patients History and Physical, chart, labs and discussed the procedure including the risks, benefits and alternatives for the proposed anesthesia with the patient or authorized representative who has indicated his/her understanding and acceptance.       Plan Discussed with: Anesthesiologist and CRNA  Anesthesia Plan Comments:         Anesthesia Quick Evaluation

## 2019-01-13 NOTE — Lactation Note (Signed)
This note was copied from a baby's chart. Lactation Consultation Note  Patient Name: Andrea Fletcher Date: 01/13/2019 Reason for consult: Initial assessment;Mother's request;Term Mom called for assistance with latching. Mom reports Andrea Fletcher is rooting, but has difficulty latching and when he does get latched he takes a few sucks and comes off the breast causing nipple discomfort.  Positioned mom for comfort with pillow support in cradle hold on breast that mom says is not his favorite.  Demonstrated hand expression of colostrum to entice him to latch.  Mom keeps pulling back on breast to allow breathing room.  Discouraged mom from pulling back on breast causing shallow latch and demonstrated how to sandwich breast to allow breathing room and get deeper latch.  He sustained the latch and continued with strong rhythmic sucking with audible swallows.  Coconut oil given and instructed in use.  Packet of comfort gels put in refrigerator per mom's request.  She remembers how good the comfort gels felt with other baby in beginning.  Reviewed supply and demand, normal course of lactation and routine newborn feeding patterns.  Encouraged mom to put Andrea Fletcher to the breast whenever he demonstrates feeding cues.  Lactation name and number written on white board and encouraged to call with any questions, concerns or assistance.      Maternal Data Formula Feeding for Exclusion: No Has patient been taught Hand Expression?: Yes Does the patient have breastfeeding experience prior to this delivery?: Yes  Feeding Feeding Type: Breast Fed  LATCH Score Latch: Repeated attempts needed to sustain latch, nipple held in mouth throughout feeding, stimulation needed to elicit sucking reflex.  Audible Swallowing: A few with stimulation  Type of Nipple: Everted at rest and after stimulation  Comfort (Breast/Nipple): Filling, red/small blisters or bruises, mild/mod discomfort  Hold (Positioning): Assistance  needed to correctly position infant at breast and maintain latch.  LATCH Score: 6  Interventions Interventions: Breast feeding basics reviewed;Assisted with latch;Skin to skin;Breast massage;Hand express;Reverse pressure;Breast compression;Adjust position;Support pillows;Position options;Coconut oil;Comfort gels  Lactation Tools Discussed/Used Tools: Coconut oil;Comfort gels WIC Program: No(Insured)   Consult Status Consult Status: Follow-up Follow-up type: Call as needed    Louis Meckel 01/13/2019, 2:32 PM

## 2019-01-14 ENCOUNTER — Ambulatory Visit: Payer: BC Managed Care – PPO

## 2019-01-14 LAB — CBC
HCT: 31.2 % — ABNORMAL LOW (ref 36.0–46.0)
Hemoglobin: 10.3 g/dL — ABNORMAL LOW (ref 12.0–15.0)
MCH: 30.2 pg (ref 26.0–34.0)
MCHC: 33 g/dL (ref 30.0–36.0)
MCV: 91.5 fL (ref 80.0–100.0)
Platelets: 224 10*3/uL (ref 150–400)
RBC: 3.41 MIL/uL — ABNORMAL LOW (ref 3.87–5.11)
RDW: 13.3 % (ref 11.5–15.5)
WBC: 10.8 10*3/uL — ABNORMAL HIGH (ref 4.0–10.5)
nRBC: 0 % (ref 0.0–0.2)

## 2019-01-14 LAB — RPR: RPR Ser Ql: NONREACTIVE

## 2019-01-14 NOTE — Progress Notes (Signed)
Patient discharged home with infant. Discharge instructions, prescriptions and follow up appointment given to and reviewed with patient. Patient verbalized understanding. Patient wheeled out with infant by auxiliary.  

## 2019-01-14 NOTE — Anesthesia Postprocedure Evaluation (Signed)
Anesthesia Post Note  Patient: Andrea Fletcher  Procedure(s) Performed: AN AD HOC LABOR EPIDURAL  Patient location during evaluation: Mother Baby Anesthesia Type: Epidural Level of consciousness: oriented and awake and alert Pain management: pain level controlled Vital Signs Assessment: post-procedure vital signs reviewed and stable Respiratory status: spontaneous breathing and respiratory function stable Cardiovascular status: blood pressure returned to baseline and stable Postop Assessment: no headache, no backache, no apparent nausea or vomiting and able to ambulate Anesthetic complications: no     Last Vitals:  Vitals:   01/13/19 1727 01/13/19 2350  BP: (!) 101/59 98/65  Pulse: 61 70  Resp: 18 18  Temp: 36.8 C 36.6 C  SpO2: 99% 100%    Last Pain:  Vitals:   01/13/19 2350  TempSrc: Oral  PainSc:                  Starling Manns

## 2019-01-14 NOTE — Discharge Instructions (Signed)

## 2019-08-15 NOTE — L&D Delivery Note (Signed)
Delivery Note  Andrea Fletcher is a [id number] at [redacted]w[redacted]d with an LMP of 09/03/2019, consistent with [place] at [redacted]w[redacted]d.   First Stage: Labor onset: 06/16/2020 0800 Augmentation: None Analgesia /Anesthesia intrapartum: None SROM at 1150 for meconium   Second Stage: Complete dilation at 1148 Onset of pushing at 1151 FHR second stage 125bpm with moderate variability   Andrea Fletcher arrived to L&D in active labor.  She progressed quickly to C/C/Bulging membranes with a spontaneous urge to push.  Membranes ruptured spontaneously during pushing for meconium stained fluid.  Nursery notified.   Delivery of a viable baby girl on 06/16/2020 at 1155 by CNM Delivery of fetal head in OA position with restitution to LOT. No nuchal cord;  Anterior then posterior shoulders delivered easily with gentle downward traction. Baby placed on mom's chest, and attended to by peds. Placenta delivered with prior to cord clamp.  Placenta placed in bowl next to infant. Cord double clamped after cessation of pulsation, cut by [facility]   Cord blood sample collected: A Pos  Third Stage: Oxytocin bolus started after delivery of infant for hemorrhage prophylaxis  Placenta delivered intact with 3 VC @ 1203 Placenta disposition: discarded  Uterine tone firm / bleeding moderate   No laceration identified  Anesthesia for repair: none Repair N/A Est. Blood Loss (mL):  Complications: GBS positive - not adequately treated   Mom to postpartum.  Baby to Couplet care / Skin to Skin.  Newborn: Information for the patient's newborn:  Orie, Cuttino [[id number]]  Live born female "Andrea Fletcher" Birth Weight:  Pending  APGAR: 8, 9   Newborn Delivery   Birth date/time: 06/16/2020 11:55:00 Delivery type: Vaginal, Spontaneous    Feeding planned: Breast   ---------- Margaretmary Eddy, CNM Certified Nurse Midwife [facility]

## 2019-12-02 DIAGNOSIS — Z3480 Encounter for supervision of other normal pregnancy, unspecified trimester: Secondary | ICD-10-CM | POA: Insufficient documentation

## 2020-06-16 ENCOUNTER — Other Ambulatory Visit: Payer: Self-pay

## 2020-06-16 ENCOUNTER — Inpatient Hospital Stay
Admission: EM | Admit: 2020-06-16 | Discharge: 2020-06-18 | DRG: 806 | Disposition: A | Discharge status: Home / Self Care | Payer: BC Managed Care – PPO

## 2020-06-16 ENCOUNTER — Encounter: Payer: Self-pay | Admitting: Obstetrics & Gynecology

## 2020-06-16 DIAGNOSIS — O480 Post-term pregnancy: Secondary | ICD-10-CM | POA: Diagnosis present

## 2020-06-16 DIAGNOSIS — Z3A41 41 weeks gestation of pregnancy: Secondary | ICD-10-CM | POA: Diagnosis not present

## 2020-06-16 DIAGNOSIS — O9081 Anemia of the puerperium: Secondary | ICD-10-CM | POA: Diagnosis not present

## 2020-06-16 DIAGNOSIS — Z20822 Contact with and (suspected) exposure to covid-19: Secondary | ICD-10-CM | POA: Diagnosis present

## 2020-06-16 DIAGNOSIS — O99824 Streptococcus B carrier state complicating childbirth: Secondary | ICD-10-CM | POA: Diagnosis present

## 2020-06-16 DIAGNOSIS — D62 Acute posthemorrhagic anemia: Secondary | ICD-10-CM | POA: Diagnosis not present

## 2020-06-16 LAB — CBC
HCT: 33.8 % — ABNORMAL LOW (ref 36.0–46.0)
Hemoglobin: 11.3 g/dL — ABNORMAL LOW (ref 12.0–15.0)
MCH: 29.5 pg (ref 26.0–34.0)
MCHC: 33.4 g/dL (ref 30.0–36.0)
MCV: 88.3 fL (ref 80.0–100.0)
Platelets: 318 10*3/uL (ref 150–400)
RBC: 3.83 MIL/uL — ABNORMAL LOW (ref 3.87–5.11)
RDW: 13.3 % (ref 11.5–15.5)
WBC: 14 10*3/uL — ABNORMAL HIGH (ref 4.0–10.5)
nRBC: 0 % (ref 0.0–0.2)

## 2020-06-16 LAB — RESPIRATORY PANEL BY RT PCR (FLU A&B, COVID)
Influenza A by PCR: NEGATIVE
Influenza B by PCR: NEGATIVE
SARS Coronavirus 2 by RT PCR: NEGATIVE

## 2020-06-16 LAB — TYPE AND SCREEN
ABO/RH(D): A POS
Antibody Screen: NEGATIVE

## 2020-06-16 MED ORDER — OXYTOCIN 10 UNIT/ML IJ SOLN
INTRAMUSCULAR | Status: AC
  Filled 2020-06-16: qty 2

## 2020-06-16 MED ORDER — COCONUT OIL OIL: 1.0000 "application " | TOPICAL_OIL | Status: DC | PRN

## 2020-06-16 MED ORDER — LIDOCAINE HCL (PF) 1 % IJ SOLN: 30.0000 mL | INTRAMUSCULAR | Status: DC | PRN

## 2020-06-16 MED ORDER — DOCUSATE SODIUM 100 MG PO CAPS
100.0000 mg | ORAL_CAPSULE | Freq: Two times a day (BID) | ORAL | Status: DC
  Filled 2020-06-16 (×3): qty 1

## 2020-06-16 MED ORDER — SODIUM CHLORIDE 0.9 % IV SOLN: 2.0000 g | Freq: Once | INTRAVENOUS | Status: AC

## 2020-06-16 MED ORDER — BENZOCAINE-MENTHOL 20-0.5 % EX AERO
1.0000 "application " | INHALATION_SPRAY | CUTANEOUS | Status: DC | PRN
  Filled 2020-06-16: qty 56

## 2020-06-16 MED ORDER — DIPHENHYDRAMINE HCL 25 MG PO CAPS: 25.0000 mg | ORAL_CAPSULE | Freq: Four times a day (QID) | ORAL | Status: DC | PRN

## 2020-06-16 MED ORDER — SODIUM CHLORIDE 0.9 % IV SOLN
1.0000 g | INTRAVENOUS | Status: DC
  Filled 2020-06-16 (×3): qty 1000

## 2020-06-16 MED ORDER — FENTANYL CITRATE (PF) 100 MCG/2ML IJ SOLN: 100.0000 ug | INTRAMUSCULAR | Status: DC | PRN

## 2020-06-16 MED ORDER — DIBUCAINE (PERIANAL) 1 % EX OINT: 1.0000 "application " | TOPICAL_OINTMENT | CUTANEOUS | Status: DC | PRN

## 2020-06-16 MED ORDER — ONDANSETRON HCL 4 MG PO TABS: 4.0000 mg | ORAL_TABLET | ORAL | Status: DC | PRN

## 2020-06-16 MED ORDER — OXYTOCIN-SODIUM CHLORIDE 30-0.9 UT/500ML-% IV SOLN
2.5000 [IU]/h | INTRAVENOUS | Status: DC
  Filled 2020-06-16: qty 1000

## 2020-06-16 MED ORDER — IBUPROFEN 600 MG PO TABS
600.0000 mg | ORAL_TABLET | Freq: Four times a day (QID) | ORAL | Status: DC
  Administered 2020-06-16: 600 mg via ORAL
  Filled 2020-06-16: qty 1

## 2020-06-16 MED ORDER — OXYTOCIN BOLUS FROM INFUSION
333.0000 mL | Freq: Once | INTRAVENOUS | Status: AC
  Administered 2020-06-16: 333 mL via INTRAVENOUS

## 2020-06-16 MED ORDER — ONDANSETRON HCL 4 MG/2ML IJ SOLN: 4.0000 mg | Freq: Four times a day (QID) | INTRAMUSCULAR | Status: DC | PRN

## 2020-06-16 MED ORDER — IBUPROFEN 600 MG PO TABS
600.0000 mg | ORAL_TABLET | Freq: Four times a day (QID) | ORAL | Status: DC
  Administered 2020-06-16 – 2020-06-18 (×7): 600 mg via ORAL
  Filled 2020-06-16 (×7): qty 1

## 2020-06-16 MED ORDER — ACETAMINOPHEN 500 MG PO TABS: 1000.0000 mg | ORAL_TABLET | Freq: Four times a day (QID) | ORAL | Status: DC | PRN

## 2020-06-16 MED ORDER — AMMONIA AROMATIC IN INHA
RESPIRATORY_TRACT | Status: AC
  Filled 2020-06-16: qty 10

## 2020-06-16 MED ORDER — WITCH HAZEL-GLYCERIN EX PADS: 1.0000 "application " | MEDICATED_PAD | CUTANEOUS | Status: DC

## 2020-06-16 MED ORDER — PRENATAL MULTIVITAMIN CH: 1.0000 | ORAL_TABLET | Freq: Every day | ORAL | Status: DC

## 2020-06-16 MED ORDER — LACTATED RINGERS IV SOLN: INTRAVENOUS | Status: DC

## 2020-06-16 MED ORDER — MISOPROSTOL 200 MCG PO TABS
ORAL_TABLET | ORAL | Status: AC
  Filled 2020-06-16: qty 4

## 2020-06-16 MED ORDER — BUTORPHANOL TARTRATE 1 MG/ML IJ SOLN: 1.0000 mg | INTRAMUSCULAR | Status: DC | PRN

## 2020-06-16 MED ORDER — SOD CITRATE-CITRIC ACID 500-334 MG/5ML PO SOLN: 30.0000 mL | ORAL | Status: DC | PRN

## 2020-06-16 MED ORDER — LACTATED RINGERS IV SOLN: 500.0000 mL | INTRAVENOUS | Status: DC | PRN

## 2020-06-16 MED ORDER — SODIUM CHLORIDE 0.9 % IV SOLN
INTRAVENOUS | Status: AC
  Administered 2020-06-16: 2 g via INTRAVENOUS
  Filled 2020-06-16: qty 2000

## 2020-06-16 MED ORDER — SIMETHICONE 80 MG PO CHEW: 80.0000 mg | CHEWABLE_TABLET | ORAL | Status: DC | PRN

## 2020-06-16 MED ORDER — ONDANSETRON HCL 4 MG/2ML IJ SOLN: 4.0000 mg | INTRAMUSCULAR | Status: DC | PRN

## 2020-06-16 MED ORDER — LIDOCAINE HCL (PF) 1 % IJ SOLN
INTRAMUSCULAR | Status: AC
  Filled 2020-06-16: qty 30

## 2020-06-16 NOTE — H&P (Addendum)
OB History & Physical   History of Present Illness:  Chief Complaint:   HPI:  Ellysia Char is a 34 y.o. [phone] female at [redacted]w[redacted]d dated by LMP of 09/03/2019, c/w early [place] at [redacted]w[redacted]d.  She presents to L&D for worsening contractions   Reports active fetal movement  Contractions: every 3 to 4 minutes starting at 8am this morning  LOF/SROM: Denies  Vaginal bleeding: Denies   Pregnancy Issues: 1. GBS positive  2. Close interval pregnancy   Patient Active Problem List   Diagnosis Date Noted  . Indication for care in labor or delivery 06/16/2020  . Encounter for supervision of normal pregnancy in multigravida 12/02/2019  . 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 Abnormal Pap smear of cervix 2006  . History of colposcopy 2006    History reviewed. No pertinent surgical history.   Not on File  Prior to Admission medications   Medication Sig Start Date End Date Taking? Authorizing Provider  Prenatal Vit-Fe Fumarate-FA (MULTIVITAMIN-PRENATAL) 27-0.8 MG TABS tablet Take 1 tablet by mouth daily at 12 noon.    [provider]     Prenatal care site:  [facility]  Social History: She  reports that she has never smoked. She has never used smokeless tobacco. She reports previous alcohol use. She reports that she does not use drugs.  Family History: family history includes Cancer in her father; Diabetes in her maternal grandfather and paternal grandfather; Hyperlipidemia in her maternal grandfather, maternal grandmother, and mother; Hypertension in her maternal grandfather, maternal grandmother, and mother; Stroke in her father, maternal grandfather, and mother; Thyroid disease in her mother.   Review of Systems: A full review of systems was performed and negative except as noted in the HPI.     Physical Exam:  Vital Signs: BP (!) 117/47   Pulse 86   Resp (!) 22    Ht 5\' 2"  (1.575 m)   Wt 65.8 kg   LMP 09/03/2019   Breastfeeding Unknown   BMI 26.52 kg/m  Physical Exam  General: no acute distress.  HEENT: normocephalic, atraumatic Heart: regular rate & rhythm.  No murmurs/rubs/gallops Lungs: clear to auscultation bilaterally, normal respiratory effort Abdomen: soft, gravid, non-tender;  EFW: 7 1/2 lbs  Pelvic:   External: Normal external female genitalia  Cervix: 7-8/80/-1   Extremities: non-tender, symmetric, No edema bilaterally.  DTRs: 2+/2+  Neurologic: Alert & oriented x 3.    Results for orders placed or performed during the hospital encounter of 06/16/20 (from the past 24 hour(s))  Type and screen [facility]     Status: None (Preliminary result)   Collection Time: 06/16/20 11:02 AM  Result Value Ref Range   ABO/RH(D) PENDING    Antibody Screen PENDING    Sample Expiration      06/19/2020,2359 Performed at [facility], [address] [place], [place]   CBC     Status: Abnormal   Collection Time: 06/16/20 11:03 AM  Result Value Ref Range   WBC 14.0 (H) 4.0 - 10.5 K/uL   RBC 3.83 (L) 3.87 - 5.11 MIL/uL   Hemoglobin 11.3 (L) 12.0 - 15.0 g/dL   HCT 13/03/21 (L) 36 - 46 %   MCV 88.3 80.0 - 100.0 fL   MCH 29.5 26.0 - 34.0 pg   MCHC 33.4 30.0 - 36.0 g/dL   RDW 23.7 62.8 - 31.5 %  Platelets 318 150 - 400 K/uL   nRBC 0.0 0.0 - 0.2 %    Pertinent Results:  Prenatal Labs: Blood type/Rh A pos  Antibody screen neg  Rubella Immune  Varicella Immune  RPR NR  HBsAg Neg  HIV NR  GC neg  Chlamydia neg  Genetic screening Declined   1 hour GTT 134  3 hour GTT N/A  GBS Positive    FHT: Baseline: 140 bpm, Variability: moderate, Accelerations: present and Decelerations: Absent TOCO: regular, every 2-3 minutes, strong to palpation  SVE:  7-8/80/-1   Cephalic by Leopolds and SVE   No results found.  Assessment:  Yolunda Kloos is a 34 y.o. [phone] female at [redacted]w[redacted]d with active  labor.   Plan:  1. Admit to Labor & Delivery; consents reviewed and obtained - Covid admission screen  - Room prepared for delivery   2. Fetal Well being  - Fetal Tracing: cat 1 - Group B Streptococcus ppx indicated: GBS pos - amp 2grams ordered  - Presentation: cephalic confirmed by SVE   3. Routine OB: - Prenatal labs reviewed, as above - Rh pos - CBC, T&S, RPR on admit - Clear fluids, IVF  4. Monitoring of labor  -  Contractions monitored with external toco -  Pelvis proven to 6 1/2  -  Plan for expectant management  -  Plan for  continuous fetal monitoring -  Maternal pain control as desired; planning regional anesthesia - Anticipate vaginal delivery  5. Post Partum Planning: - Infant feeding: breast  - Contraception: possible vasectomy for partner  - Tdap vaccine: declined  - Flu vaccine: due for current season   Gustavo Lah, [place] 06/16/20 12:23 PM  Margaretmary Eddy, CNM Certified Nurse Midwife [facility]

## 2020-06-16 NOTE — Discharge Summary (Signed)
Obstetrical Discharge Summary  Patient Name: Andrea Fletcher DOB: [birth year] MRN: [id number]  Date of Admission: 06/16/2020 Date of Delivery: 06/16/2020 Delivered by: Andrea Fletcher, CNM  Date of Discharge: 06/18/2020  [facility] [id number] last menstrual period was 09/03/2019. EDC Estimated Date of Delivery: 06/09/20 Gestational Age at Delivery: [redacted]w[redacted]d   Antepartum complications:  1. GBS Positive 2. Close interval pregnancy   Admitting Diagnosis: Active labor  Secondary Diagnosis: Patient Active Problem List   Diagnosis Date Noted  . Indication for care in labor or delivery 06/16/2020  . Encounter for supervision of normal pregnancy in multigravida 12/02/2019  . 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  She progressed quickly to C/C/Bulging membranes with a spontaneous urge to push.  Membranes ruptured spontaneously during pushing for meconium stained fluid.  Nursery notified.   Delivery Type: spontaneous vaginal delivery Anesthesia: None Placenta: spontaneous Laceration: None Episiotomy: none Newborn Data: Live born female "[name]" Birth Weight:  8#3 APGAR: 8, 9   Newborn Delivery   Birth date/time: 06/16/2020 11:55:00 Delivery type: Vaginal, Spontaneous     Postpartum Procedures: none  Edinburgh:  Edinburgh Postnatal Depression Scale Screening Tool 06/17/2020 01/14/2019  I have been able to laugh and see the funny side of things. 0 0  I have looked forward with enjoyment to things. 0 0  I have blamed myself unnecessarily when things went wrong. 1 1  I have been anxious or worried for no good reason. 1 1  I have felt scared or panicky for no good reason. 0 0  Things have been getting on top of me. 1 1  I have been so unhappy that I have had difficulty sleeping. 1 0  I have felt sad or miserable. 0 1  I  have been so unhappy that I have been crying. 0 1  The thought of harming myself has occurred to me. 0 0  Edinburgh Postnatal Depression Scale Total 4 5     Post partum course:  Patient had an uncomplicated postpartum course.  By time of discharge on PPD#2, her pain was controlled on oral pain medications; she had appropriate lochia and was ambulating, voiding without difficulty and tolerating regular diet.  She was deemed stable for discharge to home.    Discharge Physical Exam:  BP 108/64 (BP Location: Right Arm)   Pulse 62   Temp 98.4 F (36.9 C) (Oral)   Resp 16   Ht 5\' 2"  (1.575 m)   Wt 65.8 kg   LMP 09/03/2019   SpO2 99%   Breastfeeding Unknown   BMI 26.52 kg/m   General: NAD CV: RRR Pulm: CTABL, nl effort ABD: s/nd/nt, fundus firm and below the umbilicus Lochia: moderate Perineum:minimal edema/intact DVT Evaluation: LE non-ttp, no evidence of DVT on exam.  Hemoglobin  Date Value Ref Range Status  06/17/2020 9.7 (L) 12.0 - 15.0 g/dL Final   HGB  Date Value Ref Range Status  11/18/2014 10.5 (L) 12.0 - 16.0 g/dL Final   HCT  Date Value Ref Range Status  06/17/2020 29.1 (L) 36 - 46 % Final  11/16/2014 39.5 35.0 - 47.0 % Final     Disposition: stable, discharge to home. Baby Feeding: breastmilk Baby Disposition: home with mom  Rh Immune globulin given: A Pos Rubella vaccine given: Immune Varivax vaccine given: Immune Flu vaccine given in AP or PP setting: offered prior to DC Tdap vaccine given in  AP or PP setting: declined   Contraception: possible vasectomy for partner   Prenatal Labs:  Blood type/Rh A Pos   Antibody screen neg  Rubella Immune  Varicella Immune  RPR NR  HBsAg Neg  HIV NR  GC neg  Chlamydia neg  Genetic screening Declined   1 hour GTT 134  3 hour GTT N/A  GBS Positive    Plan:  Andrea Fletcher was discharged to home in good condition. Follow-up appointment with delivering provider in 6 weeks.  Discharge  Medications: Allergies as of 06/18/2020   Not on File     Medication List    TAKE these medications   acetaminophen 500 MG tablet Commonly known as: TYLENOL Take 2 tablets (1,000 mg total) by mouth every 6 (six) hours as needed (for pain scale < 4).   docusate sodium 100 MG capsule Commonly known as: COLACE Take 1 capsule (100 mg total) by mouth 2 (two) times daily.   ferrous sulfate 325 (65 FE) MG tablet Take 325 mg by mouth daily.   ibuprofen 600 MG tablet Commonly known as: ADVIL Take 1 tablet (600 mg total) by mouth every 6 (six) hours.   multivitamin-prenatal 27-0.8 MG Tabs tablet Take 1 tablet by mouth daily at 12 noon.        Follow-up Information    Andrea Fletcher, CNM. Schedule an appointment as soon as possible for a visit in 6 week(s).   Specialty: Certified Nurse Midwife Why: postpartum visit  Contact information: [address] [place] [address] [phone]               Signed: Randa Fletcher, CNM 06/18/2020 11:39 AM

## 2020-06-16 NOTE — Discharge Instructions (Signed)
Vaginal Delivery, Care After Refer to this sheet in the next few weeks. These discharge instructions provide you with information on caring for yourself after delivery. Your caregiver may also give you specific instructions. Your treatment has been planned according to the most current medical practices available, but problems sometimes occur. Call your caregiver if you have any problems or questions after you go home. HOME CARE INSTRUCTIONS 1. Take over-the-counter or prescription medicines only as directed by your caregiver or pharmacist. 2. Do not drink alcohol, especially if you are breastfeeding or taking medicine to relieve pain. 3. Do not smoke tobacco. 4. Continue to use good perineal care. Good perineal care includes: 1. Wiping your perineum from back to front 2. Keeping your perineum clean. 3. You can do sitz baths twice a day, to help keep this area clean 5. Do not use tampons, douche or have sex until your caregiver says it is okay. 6. Shower only and avoid sitting in submerged water, aside from sitz baths 7. Wear a well-fitting bra that provides breast support. 8. Eat healthy foods. 9. Drink enough fluids to keep your urine clear or pale yellow. 10. Eat high-fiber foods such as whole grain cereals and breads, brown rice, beans, and fresh fruits and vegetables every day. These foods may help prevent or relieve constipation. 11. Avoid constipation with high fiber foods or medications, such as miralax or metamucil 12. Follow your caregiver's recommendations regarding resumption of activities such as climbing stairs, driving, lifting, exercising, or traveling. 13. Talk to your caregiver about resuming sexual activities. Resumption of sexual activities is dependent upon your risk of infection, your rate of healing, and your comfort and desire to resume sexual activity. 14. Try to have someone help you with your household activities and your newborn for at least a few days after you leave  the hospital. 15. Rest as much as possible. Try to rest or take a nap when your newborn is sleeping. 16. Increase your activities gradually. 17. Keep all of your scheduled postpartum appointments. It is very important to keep your scheduled follow-up appointments. At these appointments, your caregiver will be checking to make sure that you are healing physically and emotionally. SEEK MEDICAL CARE IF:   You are passing large clots from your vagina. Save any clots to show your caregiver.  You have a foul smelling discharge from your vagina.  You have trouble urinating.  You are urinating frequently.  You have pain when you urinate.  You have a change in your bowel movements.  You have increasing redness, pain, or swelling near your vaginal incision (episiotomy) or vaginal tear.  You have pus draining from your episiotomy or vaginal tear.  Your episiotomy or vaginal tear is separating.  You have painful, hard, or reddened breasts.  You have a severe headache.  You have blurred vision or see spots.  You feel sad or depressed.  You have thoughts of hurting yourself or your newborn.  You have questions about your care, the care of your newborn, or medicines.  You are dizzy or light-headed.  You have a rash.  You have nausea or vomiting.  You were breastfeeding and have not had a menstrual period within 12 weeks after you stopped breastfeeding.  You are not breastfeeding and have not had a menstrual period by the 12th week after delivery.  You have a fever. SEEK IMMEDIATE MEDICAL CARE IF:   You have persistent pain.  You have chest pain.  You have shortness of breath.    You faint.  You have leg pain.  You have stomach pain.  Your vaginal bleeding saturates two or more sanitary pads in 1 hour. MAKE SURE YOU:   Understand these instructions.  Will watch your condition.  Will get help right away if you are not doing well or get worse. Document Released:  07/28/2000 Document Revised: 12/15/2013 Document Reviewed: 03/27/2012 ExitCare Patient Information 2015 [employer]. This information is not intended to replace advice given to you by your health care provider. Make sure you discuss any questions you have with your health care provider.  Sitz Bath A sitz bath is a warm water bath taken in the sitting position. The water covers only the hips and butt (buttocks). We recommend using one that fits in the toilet, to help with ease of use and cleanliness. It may be used for either healing or cleaning purposes. Sitz baths are also used to relieve pain, itching, or muscle tightening (spasms). The water may contain medicine. Moist heat will help you heal and relax.  HOME CARE  Take 3 to 4 sitz baths a day. 18. Fill the bathtub half-full with warm water. 19. Sit in the water and open the drain a little. 20. Turn on the warm water to keep the tub half-full. Keep the water running constantly. 21. Soak in the water for 15 to 20 minutes. 22. After the sitz bath, pat the affected area dry. GET HELP RIGHT AWAY IF: You get worse instead of better. Stop the sitz baths if you get worse. MAKE SURE YOU:  Understand these instructions.  Will watch your condition.  Will get help right away if you are not doing well or get worse. Document Released: 09/07/2004 Document Revised: 04/24/2012 Document Reviewed: 11/28/2010 ExitCare Patient Information 2015 [employer]. This information is not intended to replace advice given to you by your health care provider. Make sure you discuss any questions you have with your health care provider.    

## 2020-06-16 NOTE — Progress Notes (Signed)
Cord clamped and cut by mother during skin to skin

## 2020-06-16 NOTE — Lactation Note (Signed)
This note was copied from a baby's chart. Lactation Consultation Note  Patient Name: Andrea Fletcher [id number] Date: 06/16/2020 Reason for consult: Initial assessment;Term  Lactation initial visit post delivery in LDR4. Mom is G4P4 SVD 2 hours ago. Baby has been at the breast 2x, with 1 stool post feeding. Transition RN and L&D RN both report successful BF.  LC introduced to mom, dad, and baby [name]. Mom BF her third child for 10 months before he self-weaned per mom due to introduction of table foods. Mom desires exclusive BF with [name], and will be working from home. No pain/discomfort associated with initial feedings, mom reporting swallowing and flange lips.  LC reviewed newborn stomach size, feeding patterns and frequency's, 8 good attempts in first 24 hours, hand expression/spoon feeding as needed, output expectations, and early hunger cues. Encouraged and reviewed benefits of skin to skin and breastfeeding for both mom and baby.  Encouraged to ask questions or ask for BF support as needed.  Maternal Data Formula Feeding for Exclusion: No Has patient been taught Hand Expression?: Yes Does the patient have breastfeeding experience prior to this delivery?: Yes (did not BF first 2, but did 3rd)  Feeding Feeding Type: Breast Fed  LATCH Score                   Interventions Interventions: Breast feeding basics reviewed  Lactation Tools Discussed/Used     Consult Status Consult Status: PRN    Danford Bad 06/16/2020, 2:53 PM

## 2020-06-17 LAB — CBC
HCT: 29.1 % — ABNORMAL LOW (ref 36.0–46.0)
Hemoglobin: 9.7 g/dL — ABNORMAL LOW (ref 12.0–15.0)
MCH: 29.4 pg (ref 26.0–34.0)
MCHC: 33.3 g/dL (ref 30.0–36.0)
MCV: 88.2 fL (ref 80.0–100.0)
Platelets: 262 10*3/uL (ref 150–400)
RBC: 3.3 MIL/uL — ABNORMAL LOW (ref 3.87–5.11)
RDW: 13.5 % (ref 11.5–15.5)
WBC: 11.5 10*3/uL — ABNORMAL HIGH (ref 4.0–10.5)
nRBC: 0 % (ref 0.0–0.2)

## 2020-06-17 LAB — RPR: RPR Ser Ql: NONREACTIVE

## 2020-06-17 MED ORDER — FERROUS SULFATE 325 (65 FE) MG PO TABS
325.0000 mg | ORAL_TABLET | Freq: Two times a day (BID) | ORAL | Status: DC
  Administered 2020-06-17 – 2020-06-18 (×3): 325 mg via ORAL
  Filled 2020-06-17 (×3): qty 1

## 2020-06-17 NOTE — Lactation Note (Signed)
This note was copied from a baby's chart. Lactation Consultation Note  Patient Name: Andrea Fletcher [id number] Date: 06/17/2020 Reason for consult: Follow-up assessment;Term  Lactation follow-up. Feedings overnight with output noted in chart. Mom notes no pain/discomfort with feeding, she is feeling cramping but is encouraged by that.  Baby last fed at 9am for 15 minutes, resting comfortably now. LC reviewed with mom potential for cluster feeding today, newborn stomach size, output expectations, use of hand expression between feedings or if baby sleeps for long time. Reviewed early cues, ongoing benefits of skin to skin today, and lactation support available; mom encouraged to call as needed.  Maternal Data Formula Feeding for Exclusion: No Has patient been taught Hand Expression?: Yes Does the patient have breastfeeding experience prior to this delivery?: Yes  Feeding Feeding Type: Breast Fed  LATCH Score                   Interventions Interventions: Breast feeding basics reviewed  Lactation Tools Discussed/Used     Consult Status Consult Status: PRN    Danford Bad 06/17/2020, 9:43 AM

## 2020-06-17 NOTE — Progress Notes (Signed)
Post Partum Day 1  Subjective: Doing well, no concerns. Ambulating without difficulty, pain managed with PO meds, tolerating regular diet, and voiding without difficulty.   No fever/chills, chest pain, shortness of breath, nausea/vomiting, or leg pain. No nipple or breast pain.   Objective: BP (!) 98/56 (BP Location: Left Arm)   Pulse 79   Temp 98 F (36.7 C) (Oral)   Resp 16   Ht 5\' 2"  (1.575 m)   Wt 65.8 kg   LMP 09/03/2019   SpO2 100%   Breastfeeding Unknown   BMI 26.52 kg/m    Physical Exam:  General: alert and cooperative Breasts: soft/nontender CV: RRR Pulm: nl effort Abdomen: soft, non-tender Uterine Fundus: firm Incision: n/a Perineum: minimal edema, intact Lochia: appropriate DVT Evaluation: No evidence of DVT seen on physical exam. Edinburgh:  Edinburgh Postnatal Depression Scale Screening Tool 06/17/2020 01/14/2019  I have been able to laugh and see the funny side of things. 0 0  I have looked forward with enjoyment to things. 0 0  I have blamed myself unnecessarily when things went wrong. 1 1  I have been anxious or worried for no good reason. 1 1  I have felt scared or panicky for no good reason. 0 0  Things have been getting on top of me. 1 1  I have been so unhappy that I have had difficulty sleeping. 1 0  I have felt sad or miserable. 0 1  I have been so unhappy that I have been crying. 0 1  The thought of harming myself has occurred to me. 0 0  Edinburgh Postnatal Depression Scale Total 4 5     Recent Labs    06/16/20 1103 06/17/20 0629  HGB 11.3* 9.7*  HCT 33.8* 29.1*  WBC 14.0* 11.5*  PLT 318 262    Assessment/Plan: 34 y.o. G4P4004 postpartum day # 1  -Continue routine postpartum care -Lactation consult PRN for breastfeeding   -Acute blood loss anemia - hemodynamically stable and asymptomatic; start PO ferrous sulfate BID with stool softeners  -Immunization status: Needs flu prior to discharge  Disposition: Continue inpatient postpartum  care Desires discharge home today however baby was not adequately treated for GBS so Peds would like baby to stay 48 hours.   LOS: 1 day   Rosann Gorum, CNM 06/17/2020, 8:07 AM

## 2020-06-18 MED ORDER — ACETAMINOPHEN 500 MG PO TABS: 1000.0000 mg | ORAL_TABLET | Freq: Four times a day (QID) | ORAL | 0 refills | Status: AC | PRN

## 2020-06-18 MED ORDER — IBUPROFEN 600 MG PO TABS: 600.0000 mg | ORAL_TABLET | Freq: Four times a day (QID) | ORAL | 0 refills | Status: AC

## 2020-06-18 MED ORDER — DOCUSATE SODIUM 100 MG PO CAPS: 100.0000 mg | ORAL_CAPSULE | Freq: Two times a day (BID) | ORAL | 0 refills | Status: AC

## 2020-06-18 NOTE — Progress Notes (Signed)
Patient discharged home with infant. Discharge instructions, prescriptions and follow up appointment given to and reviewed with patient. Patient verbalized understanding. Pt wheeled out with infant by auxiliary.
